# Patient Record
Sex: Male | Born: 1980 | Hispanic: Yes | Marital: Single | Smoking: Never smoker
Health system: Southern US, Community
[De-identification: ages and names within clinical notes are randomized; demographics above are authoritative.]

## PROBLEM LIST (undated history)

## (undated) DIAGNOSIS — M75100 Unspecified rotator cuff tear or rupture of unspecified shoulder, not specified as traumatic: Secondary | ICD-10-CM

## (undated) DIAGNOSIS — I10 Essential (primary) hypertension: Secondary | ICD-10-CM

## (undated) DIAGNOSIS — E785 Hyperlipidemia, unspecified: Secondary | ICD-10-CM

## (undated) DIAGNOSIS — K5900 Constipation, unspecified: Secondary | ICD-10-CM

## (undated) DIAGNOSIS — E039 Hypothyroidism, unspecified: Secondary | ICD-10-CM

## (undated) DIAGNOSIS — K219 Gastro-esophageal reflux disease without esophagitis: Secondary | ICD-10-CM

## (undated) DIAGNOSIS — E119 Type 2 diabetes mellitus without complications: Secondary | ICD-10-CM

## (undated) DIAGNOSIS — E559 Vitamin D deficiency, unspecified: Secondary | ICD-10-CM

## (undated) HISTORY — PX: SHOULDER SURGERY: SHX246

---

## 2017-09-04 ENCOUNTER — Ambulatory Visit: Payer: Self-pay

## 2017-09-04 ENCOUNTER — Other Ambulatory Visit: Payer: Self-pay | Admitting: Family Medicine

## 2017-09-04 DIAGNOSIS — M25521 Pain in right elbow: Secondary | ICD-10-CM

## 2017-09-04 DIAGNOSIS — M79641 Pain in right hand: Secondary | ICD-10-CM

## 2017-09-04 DIAGNOSIS — M25531 Pain in right wrist: Secondary | ICD-10-CM

## 2017-09-04 DIAGNOSIS — M25511 Pain in right shoulder: Secondary | ICD-10-CM

## 2017-09-07 ENCOUNTER — Other Ambulatory Visit: Payer: Self-pay

## 2017-09-07 ENCOUNTER — Other Ambulatory Visit: Payer: Self-pay | Admitting: Orthopedic Surgery

## 2017-09-07 ENCOUNTER — Encounter (HOSPITAL_BASED_OUTPATIENT_CLINIC_OR_DEPARTMENT_OTHER): Payer: Self-pay | Admitting: *Deleted

## 2017-09-07 NOTE — Progress Notes (Signed)
Spanish interpreter Ricki Rodriguezdriana [phone]) from [facility] was used to complete the pre-op phone screening.   A separate message was left with the office of inclusion to set up for a spanish interpreter the DOS- 09/08/17 to be here at 1300.

## 2017-09-14 NOTE — Pre-Procedure Instructions (Signed)
Maureen RalphsCesar will be interpreter for pt., per Darl PikesSusan at [facility]; please call [phone] if surgery time changes.

## 2017-09-15 ENCOUNTER — Ambulatory Visit (HOSPITAL_BASED_OUTPATIENT_CLINIC_OR_DEPARTMENT_OTHER)
Admission: RE | Admit: 2017-09-15 | Discharge: 2017-09-15 | Disposition: A | Discharge status: Home / Self Care | Payer: Worker's Compensation | Source: Ambulatory Visit | Attending: Orthopedic Surgery | Admitting: Orthopedic Surgery

## 2017-09-15 ENCOUNTER — Ambulatory Visit (HOSPITAL_BASED_OUTPATIENT_CLINIC_OR_DEPARTMENT_OTHER): Payer: Worker's Compensation | Admitting: Anesthesiology

## 2017-09-15 ENCOUNTER — Encounter (HOSPITAL_BASED_OUTPATIENT_CLINIC_OR_DEPARTMENT_OTHER): Payer: Self-pay | Admitting: Certified Registered"

## 2017-09-15 ENCOUNTER — Encounter (HOSPITAL_BASED_OUTPATIENT_CLINIC_OR_DEPARTMENT_OTHER)
Admission: RE | Disposition: A | Discharge status: Home / Self Care | Payer: Self-pay | Source: Ambulatory Visit | Attending: Orthopedic Surgery

## 2017-09-15 ENCOUNTER — Other Ambulatory Visit: Payer: Self-pay

## 2017-09-15 DIAGNOSIS — E039 Hypothyroidism, unspecified: Secondary | ICD-10-CM | POA: Insufficient documentation

## 2017-09-15 DIAGNOSIS — W1789XA Other fall from one level to another, initial encounter: Secondary | ICD-10-CM | POA: Diagnosis not present

## 2017-09-15 DIAGNOSIS — Z7982 Long term (current) use of aspirin: Secondary | ICD-10-CM | POA: Insufficient documentation

## 2017-09-15 DIAGNOSIS — E785 Hyperlipidemia, unspecified: Secondary | ICD-10-CM | POA: Diagnosis not present

## 2017-09-15 DIAGNOSIS — S52501A Unspecified fracture of the lower end of right radius, initial encounter for closed fracture: Secondary | ICD-10-CM | POA: Diagnosis present

## 2017-09-15 DIAGNOSIS — Z79899 Other long term (current) drug therapy: Secondary | ICD-10-CM | POA: Diagnosis not present

## 2017-09-15 DIAGNOSIS — E119 Type 2 diabetes mellitus without complications: Secondary | ICD-10-CM | POA: Diagnosis not present

## 2017-09-15 DIAGNOSIS — S52571A Other intraarticular fracture of lower end of right radius, initial encounter for closed fracture: Secondary | ICD-10-CM | POA: Diagnosis not present

## 2017-09-15 DIAGNOSIS — I10 Essential (primary) hypertension: Secondary | ICD-10-CM | POA: Insufficient documentation

## 2017-09-15 DIAGNOSIS — K219 Gastro-esophageal reflux disease without esophagitis: Secondary | ICD-10-CM | POA: Insufficient documentation

## 2017-09-15 DIAGNOSIS — Z7984 Long term (current) use of oral hypoglycemic drugs: Secondary | ICD-10-CM | POA: Diagnosis not present

## 2017-09-15 DIAGNOSIS — Y990 Civilian activity done for income or pay: Secondary | ICD-10-CM | POA: Insufficient documentation

## 2017-09-15 DIAGNOSIS — Z79890 Hormone replacement therapy (postmenopausal): Secondary | ICD-10-CM | POA: Diagnosis not present

## 2017-09-15 HISTORY — DX: Hyperlipidemia, unspecified: E78.5

## 2017-09-15 HISTORY — DX: Essential (primary) hypertension: I10

## 2017-09-15 HISTORY — PX: OPEN REDUCTION INTERNAL FIXATION (ORIF) DISTAL RADIAL FRACTURE: SHX5989

## 2017-09-15 HISTORY — DX: Gastro-esophageal reflux disease without esophagitis: K21.9

## 2017-09-15 HISTORY — DX: Type 2 diabetes mellitus without complications: E11.9

## 2017-09-15 HISTORY — DX: Hypothyroidism, unspecified: E03.9

## 2017-09-15 LAB — POCT I-STAT, CHEM 8
BUN: 10 mg/dL (ref 6–20)
CHLORIDE: 104 mmol/L (ref 98–111)
Calcium, Ion: 1.31 mmol/L (ref 1.15–1.40)
Creatinine, Ser: 0.8 mg/dL (ref 0.61–1.24)
Glucose, Bld: 120 mg/dL — ABNORMAL HIGH (ref 70–99)
HEMATOCRIT: 49 % (ref 39.0–52.0)
Hemoglobin: 16.7 g/dL (ref 13.0–17.0)
POTASSIUM: 3.9 mmol/L (ref 3.5–5.1)
SODIUM: 142 mmol/L (ref 135–145)
TCO2: 22 mmol/L (ref 22–32)

## 2017-09-15 SURGERY — OPEN REDUCTION INTERNAL FIXATION (ORIF) DISTAL RADIUS FRACTURE
Anesthesia: General | Site: Arm Lower | Laterality: Right

## 2017-09-15 MED ORDER — PROPOFOL 10 MG/ML IV BOLUS
INTRAVENOUS | Status: DC | PRN
  Administered 2017-09-15: 200 mg via INTRAVENOUS

## 2017-09-15 MED ORDER — MIDAZOLAM HCL 2 MG/2ML IJ SOLN
1.0000 mg | INTRAMUSCULAR | Status: DC | PRN
  Administered 2017-09-15: 2 mg via INTRAVENOUS

## 2017-09-15 MED ORDER — LIDOCAINE HCL (CARDIAC) PF 100 MG/5ML IV SOSY
PREFILLED_SYRINGE | INTRAVENOUS | Status: DC | PRN
  Administered 2017-09-15: 100 mg via INTRAVENOUS

## 2017-09-15 MED ORDER — ROPIVACAINE HCL 7.5 MG/ML IJ SOLN
INTRAMUSCULAR | Status: DC | PRN
  Administered 2017-09-15: 20 mL via PERINEURAL

## 2017-09-15 MED ORDER — LACTATED RINGERS IV SOLN
INTRAVENOUS | Status: DC
  Administered 2017-09-15: 13:00:00 via INTRAVENOUS

## 2017-09-15 MED ORDER — HYDROCODONE-ACETAMINOPHEN 5-325 MG PO TABS: 1.0000 | ORAL_TABLET | Freq: Four times a day (QID) | ORAL | 0 refills | Status: DC | PRN

## 2017-09-15 MED ORDER — FENTANYL CITRATE (PF) 100 MCG/2ML IJ SOLN: 25.0000 ug | INTRAMUSCULAR | Status: DC | PRN

## 2017-09-15 MED ORDER — SCOPOLAMINE 1 MG/3DAYS TD PT72
1.0000 | MEDICATED_PATCH | Freq: Once | TRANSDERMAL | Status: DC | PRN
Start: 2017-09-15 — End: 2017-09-15

## 2017-09-15 MED ORDER — PROMETHAZINE HCL 25 MG/ML IJ SOLN: 6.2500 mg | INTRAMUSCULAR | Status: DC | PRN

## 2017-09-15 MED ORDER — MIDAZOLAM HCL 2 MG/2ML IJ SOLN
INTRAMUSCULAR | Status: AC
  Filled 2017-09-15: qty 2

## 2017-09-15 MED ORDER — FENTANYL CITRATE (PF) 100 MCG/2ML IJ SOLN
50.0000 ug | INTRAMUSCULAR | Status: DC | PRN
  Administered 2017-09-15: 50 ug via INTRAVENOUS

## 2017-09-15 MED ORDER — CEFAZOLIN SODIUM-DEXTROSE 2-4 GM/100ML-% IV SOLN
2.0000 g | INTRAVENOUS | Status: AC
  Administered 2017-09-15: 2 g via INTRAVENOUS

## 2017-09-15 MED ORDER — FENTANYL CITRATE (PF) 100 MCG/2ML IJ SOLN
INTRAMUSCULAR | Status: AC
  Filled 2017-09-15: qty 2

## 2017-09-15 MED ORDER — CEFAZOLIN SODIUM-DEXTROSE 2-4 GM/100ML-% IV SOLN
INTRAVENOUS | Status: AC
  Filled 2017-09-15: qty 100

## 2017-09-15 MED ORDER — DEXAMETHASONE SODIUM PHOSPHATE 4 MG/ML IJ SOLN
INTRAMUSCULAR | Status: DC | PRN
  Administered 2017-09-15: 10 mg via INTRAVENOUS

## 2017-09-15 MED ORDER — CHLORHEXIDINE GLUCONATE 4 % EX LIQD: 60.0000 mL | Freq: Once | CUTANEOUS | Status: DC

## 2017-09-15 SURGICAL SUPPLY — 59 items
BIT DRILL 2.0 LNG QUCK RELEASE (BIT) ×1 IMPLANT
BIT DRILL 2.8X5 QR DISP (BIT) ×3 IMPLANT
BLADE MINI RND TIP GREEN BEAV (BLADE) ×3 IMPLANT
BLADE SURG 15 STRL LF DISP TIS (BLADE) ×1 IMPLANT
BLADE SURG 15 STRL SS (BLADE) ×2
BNDG COHESIVE 3X5 TAN STRL LF (GAUZE/BANDAGES/DRESSINGS) ×3 IMPLANT
BNDG ESMARK 4X9 LF (GAUZE/BANDAGES/DRESSINGS) ×3 IMPLANT
BNDG GAUZE ELAST 4 BULKY (GAUZE/BANDAGES/DRESSINGS) ×3 IMPLANT
CHLORAPREP W/TINT 26ML (MISCELLANEOUS) ×3 IMPLANT
CORD BIPOLAR FORCEPS 12FT (ELECTRODE) ×3 IMPLANT
COVER BACK TABLE 60X90IN (DRAPES) ×3 IMPLANT
COVER MAYO STAND STRL (DRAPES) ×3 IMPLANT
CUFF TOURNIQUET SINGLE 18IN (TOURNIQUET CUFF) ×3 IMPLANT
DECANTER SPIKE VIAL GLASS SM (MISCELLANEOUS) IMPLANT
DRAPE EXTREMITY T 121X128X90 (DRAPE) ×3 IMPLANT
DRAPE OEC MINIVIEW 54X84 (DRAPES) ×3 IMPLANT
DRAPE SURG 17X23 STRL (DRAPES) ×3 IMPLANT
DRILL 2.0 LNG QUICK RELEASE (BIT) ×3
GAUZE SPONGE 4X4 12PLY STRL (GAUZE/BANDAGES/DRESSINGS) ×3 IMPLANT
GAUZE XEROFORM 1X8 LF (GAUZE/BANDAGES/DRESSINGS) ×3 IMPLANT
GLOVE BIO SURGEON STRL SZ 6.5 (GLOVE) ×2 IMPLANT
GLOVE BIO SURGEONS STRL SZ 6.5 (GLOVE) ×1
GLOVE BIOGEL PI IND STRL 7.0 (GLOVE) ×2 IMPLANT
GLOVE BIOGEL PI IND STRL 8 (GLOVE) ×1 IMPLANT
GLOVE BIOGEL PI IND STRL 8.5 (GLOVE) ×1 IMPLANT
GLOVE BIOGEL PI INDICATOR 7.0 (GLOVE) ×4
GLOVE BIOGEL PI INDICATOR 8 (GLOVE) ×2
GLOVE BIOGEL PI INDICATOR 8.5 (GLOVE) ×2
GLOVE SURG ORTHO 8.0 STRL STRW (GLOVE) ×3 IMPLANT
GOWN STRL REUS W/ TWL LRG LVL3 (GOWN DISPOSABLE) ×1 IMPLANT
GOWN STRL REUS W/TWL LRG LVL3 (GOWN DISPOSABLE) ×2
GOWN STRL REUS W/TWL XL LVL3 (GOWN DISPOSABLE) ×6 IMPLANT
GUIDEWIRE ORTHO 0.054X6 (WIRE) ×9 IMPLANT
NEEDLE PRECISIONGLIDE 27X1.5 (NEEDLE) IMPLANT
NS IRRIG 1000ML POUR BTL (IV SOLUTION) ×3 IMPLANT
PACK BASIN DAY SURGERY FS (CUSTOM PROCEDURE TRAY) ×3 IMPLANT
PAD CAST 3X4 CTTN HI CHSV (CAST SUPPLIES) ×1 IMPLANT
PADDING CAST COTTON 3X4 STRL (CAST SUPPLIES) ×2
PLATE R NARROW PROC VDR (Plate) ×3 IMPLANT
SCREW CORT FT 18X2.3XLCK HEX (Screw) ×1 IMPLANT
SCREW CORT FT 20X2.3XLCK HEX (Screw) ×1 IMPLANT
SCREW CORTICAL LOCKING 2.3X18M (Screw) ×2 IMPLANT
SCREW CORTICAL LOCKING 2.3X20M (Screw) ×8 IMPLANT
SCREW CORTICAL LOCKING 2.3X22M (Screw) ×2 IMPLANT
SCREW FX20X2.3XSMTH LCK NS CRT (Screw) ×3 IMPLANT
SCREW FX22X2.3XLCK SMTH NS CRT (Screw) ×1 IMPLANT
SCREW NLCKG 13 3.5X13 HEXA (Screw) ×1 IMPLANT
SCREW NON-LOCK 3.5X13 (Screw) ×3 IMPLANT
SCREW NONLOCK HEX 3.5X12 (Screw) ×6 IMPLANT
SLEEVE SCD COMPRESS KNEE MED (MISCELLANEOUS) ×3 IMPLANT
SPLINT PLASTER CAST XFAST 3X15 (CAST SUPPLIES) ×10 IMPLANT
SPLINT PLASTER XTRA FASTSET 3X (CAST SUPPLIES) ×20
STOCKINETTE 4X48 STRL (DRAPES) ×3 IMPLANT
SUT ETHILON 4 0 PS 2 18 (SUTURE) ×3 IMPLANT
SUT VICRYL 4-0 PS2 18IN ABS (SUTURE) ×3 IMPLANT
SYR BULB 3OZ (MISCELLANEOUS) ×3 IMPLANT
SYR CONTROL 10ML LL (SYRINGE) IMPLANT
TOWEL GREEN STERILE FF (TOWEL DISPOSABLE) ×3 IMPLANT
UNDERPAD 30X30 (UNDERPADS AND DIAPERS) ×3 IMPLANT

## 2017-09-15 NOTE — Anesthesia Procedure Notes (Signed)
Anesthesia Regional Block: Supraclavicular block   Pre-Anesthetic Checklist: ,, timeout performed, Correct Patient, Correct Site, Correct Laterality, Correct Procedure, Correct Position, site marked, Risks and benefits discussed,  Surgical consent,  Pre-op evaluation,  At surgeon's request and post-op pain management  Laterality: Right  Prep: chloraprep       Needles:  Injection technique: Single-shot  Needle Type: Echogenic Needle     Needle Length: 9cm  Needle Gauge: 21     Additional Needles:   Procedures:,,,, ultrasound used (permanent image in chart),,,,  Narrative:  Start time: 09/15/2017 1:05 PM End time: 09/15/2017 1:10 PM Injection made incrementally with aspirations every 5 mL.  Performed by: Personally  Anesthesiologist: Cecile Hearingurk, Demetric Parslow Edward, MD  Additional Notes: No pain on injection. No increased resistance to injection. Injection made in 5cc increments.  Good needle visualization.  Patient tolerated procedure well.

## 2017-09-15 NOTE — Anesthesia Procedure Notes (Signed)
Procedure Name: LMA Insertion Date/Time: 09/15/2017 1:42 PM Performed by: Montez Moritaarter, Tarnisha Kachmar W, CRNA Pre-anesthesia Checklist: Patient identified, Emergency Drugs available, Suction available and Patient being monitored Patient Re-evaluated:Patient Re-evaluated prior to induction Oxygen Delivery Method: Circle system utilized Preoxygenation: Pre-oxygenation with 100% oxygen Induction Type: IV induction Ventilation: Mask ventilation without difficulty LMA: LMA inserted LMA Size: 4.0 Number of attempts: 1 Placement Confirmation: positive ETCO2 and breath sounds checked- equal and bilateral Tube secured with: Tape Dental Injury: Teeth and Oropharynx as per pre-operative assessment

## 2017-09-15 NOTE — Op Note (Signed)
NAME: Terrence Doyle MEDICAL RECORD NO: [id number] DATE OF BIRTH: Oct 22, 1980 FACILITY: [facility] LOCATION: [facility] PHYSICIAN: Terrence Reaper, MD   OPERATIVE REPORT   DATE OF PROCEDURE: 09/15/17    PREOPERATIVE DIAGNOSIS:   Distal radius fracture right wrist part   POSTOPERATIVE DIAGNOSIS:   Same   PROCEDURE:   Open reduction internal fixation Acumed volar plate right wrist   SURGEON: Terrence Doyle, M.D.   ASSISTANT: none Terrence Loa, MD   ANESTHESIA:  Bier block   INTRAVENOUS FLUIDS:  Per anesthesia flow sheet.   ESTIMATED BLOOD LOSS:  Minimal.   COMPLICATIONS:  None.   SPECIMENS:  none   TOURNIQUET TIME:    Total Tourniquet Time Documented: Upper Arm (Right) - 43 minutes Total: Upper Arm (Right) - 43 minutes    DISPOSITION:  Stable to PACU.   INDICATIONS: Patient is a 37 year old male who sustained a fracture comminuted intra-articular of his right distal radius and a fall wall trimming a tree.  He underwent closed reduction.  This has slipped.  He is admitted for open reduction internal fixation.  Pre-peri-and postoperative course been discussed along with risk complications.  He is aware that there is no guarantee to the surgery the possibility of infection recurrence injury to arteries nerves tendons and complete relief symptoms dystrophy loss of fixation nonunion.  Preoperative area the patient is seen extremity marked by both patient and surgeon antibiotic given a supraclavicular block was carried out without difficulty in the preoperative area under the direction the anesthesia department.  OPERATIVE COURSE: The patient was brought to the operating room where he was placed in the supine position with the right arm free.  He was prepped and draped using ChloraPrep and a three-minute dry time allowed timeout taken to confirm patient procedure.  The limb was exsanguinated with an Esmarch bandage turn placed on the arm was inflated to 250 mmHg.  A volar  incision was made for placement of the volar distal radius plate.  This carried down through subcutaneous tissue.  Bleeders were electrocauterized with bipolar.  The volar branch of the radial artery was identified protected and incision made through the flexor carpi radialis tendon sheath carried down to the pronator quadratus.  The brachial radialis was then tenotomized to relieving the radial styloid.  The incisions were made in the pronator quadratus L and shape at the watershed area.  The pronator was then elevated off the distal radius.  The displaced fracture was immediately apparent.  This was debrided after opening it up and reducing it after placing clamps the proximally.  A Acumed volar plate was placed pinned in position was found to be proximal and slightly large.  This was replaced with a narrow plate.  This was repositioned pinned in position this was adjusted to using the image intensification until applied in good position both AP and lateral direction.  The gliding screw was placed this measured 12 mm.  Distal pegs were then placed in the distal holes after drilling measuring these measured between 20 mm.  The radial screws were placed this was 18 and 21 peg was 22 on the area of the Lister's tubercle.  Final distal PEG was placed as measured 20 mm.  Of the proximal 2 screws holes were drilled these measured 13 and 12.  These were placed x-rays were taken revealing the fracture reduced with the plate in good position.  The wound was copiously irrigated with saline.  The pronator was repaired with figure-of-eight 4-0  Vicryl sutures.  The subcutaneous tissue was closed with interrupted 4-0 Vicryl and skin with interrupted 4-0 nylon sutures.  A sterile compressive dressing volar splint was applied.  Pronation supination revealed no impingement.  With full pronation supination being available.  Patient was taken to the recovery room for observation in satisfactory condition.  He will be discharged home  on Terrence Doyle.   Terrence Doyle R, MD Electronically signed, 09/15/17

## 2017-09-15 NOTE — Op Note (Signed)
I assisted Surgeon(s) and Role:    * Cindee SaltKuzma, Gary, MD - Primary    Betha Loa* Lydie Stammen, MD - Assisting on the Procedure(s): OPEN REDUCTION INTERNAL FIXATION (ORIF) DISTAL RADIAL FRACTURE on 09/15/2017.  I provided assistance on this case as follows: retraction soft tissues, reduction of fracture, placement hardware.  Electronically signed by: Tami RibasKUZMA,Wave Calzada R, MD Date: 09/15/2017 Time: 2:40 PM

## 2017-09-15 NOTE — Transfer of Care (Signed)
Immediate Anesthesia Transfer of Care Note  Patient: Terrence Doyle  Procedure(s) Performed: OPEN REDUCTION INTERNAL FIXATION (ORIF) DISTAL RADIAL FRACTURE (Right Arm Lower)  Patient Location: PACU  Anesthesia Type:General  Level of Consciousness: sedated  Airway & Oxygen Therapy: Patient Spontanous Breathing and Patient connected to face mask oxygen  Post-op Assessment: Report given to RN and Post -op Vital signs reviewed and stable  Post vital signs: Reviewed and stable  Last Vitals:  Vitals Value Taken Time  BP 84/59 09/15/2017  2:40 PM  Temp    Pulse 58 09/15/2017  2:41 PM  Resp 14 09/15/2017  2:41 PM  SpO2 100 % 09/15/2017  2:41 PM  Vitals shown include unvalidated device data.  Last Pain:  Vitals:   09/15/17 1228  TempSrc: Oral  PainSc: 0-No pain         Complications: No apparent anesthesia complications

## 2017-09-15 NOTE — H&P (Signed)
Terrence Doyle is an 37 y.o. male.   Chief Complaint: distal radius fracture right HPI: Terrence Doyle is a 37 year old right-hand-dominant male who sustained an injury to his right dominant hand in a fall on 09/04/2017. He was working on a tree. Is on a wall when he slipped and fell on outstretched hand. [facility] Dr[facility] Georgina PillionMassey and referred. X-rays reveal a comminuted intra-articular fracture of his right distal radius displaced. He complains of moderate pain was given Toradol. Is placed in a splint and referred. He has a prior history at the age of 89 when he fell from horse. His pain has significantly abated with the thumb spica splint. He has a history of diabetes thyroid problems no history of arthritis or gout. Family history is positive diabetes negative for the remainder.      Past Medical History:  Diagnosis Date  . Diabetes mellitus without complication (HCC)   . GERD (gastroesophageal reflux disease)   . Hyperlipidemia   . Hypertension   . Hypothyroidism     History reviewed. No pertinent surgical history.  History reviewed. No pertinent family history. Social History:  reports that he has never smoked. He has never used smokeless tobacco. He reports that he does not drink alcohol or use drugs.  Allergies: No Known Allergies  No medications prior to admission.    No results found for this or any previous visit (from the past 48 hour(s)).  No results found.   Pertinent items are noted in HPI.  Weight 77.1 kg (170 lb).  General appearance: alert, cooperative and appears stated age Head: Normocephalic, without obvious abnormality Neck: no JVD Resp: clear to auscultation bilaterally Cardio: regular rate and rhythm, S1, S2 normal, no murmur, click, rub or gallop GI: soft, non-tender; bowel sounds normal; no masses,  no organomegaly Extremities: deformity right wrist Pulses: 2+ and symmetric Skin: Skin color, texture, turgor normal. No rashes or  lesions Neurologic: Grossly normal Incision/Wound: na  Assessment/Plan Assessment:  1. Other closed fracture of distal end of right radius,    Plan: Dr. Georgina PillionMassey notes are reviewed. We have discussed the injury through a virtual interpreter. He is aware that this probably going to require open reduction internal fixation but would return back recommend hematoma block and reduction followed by splinting for improved positioning. Permit is obtained for this the hematoma block was given with percent Xylocaine without epinephrine. Manipulation was performed with application dorsal palmar splint. Will be discharged on him at all. Post reduction x-rays reveal a fracture in good position but is markedly comminuted. Through the interpreter we have discussed with him that this is going to require open reduction internal fixation. Pre-peri-and postoperative course are discussed along with risk complications. He is advised that there is no guarantee that with the surgery the possibility of infection recurrence injury to arteries nerves tendons complete relief symptoms dystrophy the possibility of nonunion. Stability of fixation problems. He is scheduled for open reduction internal fixation right distal radius and outpatient under regional anesthesia.      Arely Tinner R 09/15/2017, 11:06 AM

## 2017-09-15 NOTE — Anesthesia Preprocedure Evaluation (Signed)
Anesthesia Evaluation  Patient identified by MRN, date of birth, ID band Patient awake    Reviewed: Allergy & Precautions, NPO status , Patient's Chart, lab work & pertinent test results  Airway Mallampati: II  TM Distance: >3 FB Neck ROM: Full    Dental  (+) Teeth Intact, Dental Advisory Given, Chipped,    Pulmonary neg pulmonary ROS,    Pulmonary exam normal breath sounds clear to auscultation       Cardiovascular hypertension, Pt. on medications Normal cardiovascular exam Rhythm:Regular Rate:Normal     Neuro/Psych negative neurological ROS  negative psych ROS   GI/Hepatic Neg liver ROS, GERD  Medicated,  Endo/Other  diabetes, Type 2, Oral Hypoglycemic AgentsHypothyroidism   Renal/GU negative Renal ROS     Musculoskeletal negative musculoskeletal ROS (+)   Abdominal   Peds  Hematology negative hematology ROS (+)   Anesthesia Other Findings Day of surgery medications reviewed with the patient.  Reproductive/Obstetrics                             Anesthesia Physical Anesthesia Plan  ASA: II  Anesthesia Plan: General   Post-op Pain Management:  Regional for Post-op pain   Induction: Intravenous  PONV Risk Score and Plan: 2 and Dexamethasone, Ondansetron and Midazolam  Airway Management Planned: LMA  Additional Equipment:   Intra-op Plan:   Post-operative Plan: Extubation in OR  Informed Consent: I have reviewed the patients History and Physical, chart, labs and discussed the procedure including the risks, benefits and alternatives for the proposed anesthesia with the patient or authorized representative who has indicated his/her understanding and acceptance.   Dental advisory given  Plan Discussed with: CRNA  Anesthesia Plan Comments: (Spanish interpreter)        Anesthesia Quick Evaluation

## 2017-09-15 NOTE — Progress Notes (Signed)
Assisted Dr. Turk with right, ultrasound guided, supraclavicular block. Side rails up, monitors on throughout procedure. See vital signs in flow sheet. Tolerated Procedure well. 

## 2017-09-15 NOTE — Brief Op Note (Signed)
09/15/2017  2:37 PM  PATIENT:  Terrence Doyle  37 y.o. male  PRE-OPERATIVE DIAGNOSIS:  RIGHT DISTAL RADIUS FRACTURE  POST-OPERATIVE DIAGNOSIS:  RIGHT DISTAL RADIUS FRACTURE  PROCEDURE:  Procedure(s) with comments: OPEN REDUCTION INTERNAL FIXATION (ORIF) DISTAL RADIAL FRACTURE (Right) - block  SURGEON:  Surgeon(s) and Role:    * Cindee SaltKuzma, Kalisi Bevill, MD - Primary    * Betha LoaKuzma, Kevin, MD - Assisting  PHYSICIAN ASSISTANT:   ASSISTANTSANESTHESIA:   regional and IV sedation  EBL:  10 mL   BLOOD ADMINISTERED:none  DRAINS: none   LOCAL MEDICATIONS USED:  NONE  SPECIMEN:  No Specimen  DISPOSITION OF SPECIMEN:  N/A  COUNTS:  YES  TOURNIQUET:   Total Tourniquet Time Documented: Upper Arm (Right) - 43 minutes Total: Upper Arm (Right) - 43 minutes   DICTATION: .Reubin Milanragon Dictation  PLAN OF CARE: Discharge to home after PACU  PATIENT DISPOSITION:  PACU - hemodynamically stable.

## 2017-09-15 NOTE — Discharge Instructions (Addendum)

## 2017-09-16 ENCOUNTER — Encounter (HOSPITAL_BASED_OUTPATIENT_CLINIC_OR_DEPARTMENT_OTHER): Payer: Self-pay | Admitting: Orthopedic Surgery

## 2017-09-16 NOTE — Anesthesia Postprocedure Evaluation (Signed)
Anesthesia Post Note  Patient: Terrence Doyle  Procedure(s) Performed: OPEN REDUCTION INTERNAL FIXATION (ORIF) DISTAL RADIAL FRACTURE (Right Arm Lower)     Patient location during evaluation: PACU Anesthesia Type: General Level of consciousness: awake and alert Pain management: pain level controlled Vital Signs Assessment: post-procedure vital signs reviewed and stable Respiratory status: spontaneous breathing, nonlabored ventilation and respiratory function stable Cardiovascular status: blood pressure returned to baseline and stable Postop Assessment: no apparent nausea or vomiting Anesthetic complications: no    Last Vitals:  Vitals:   09/15/17 1530 09/15/17 1600  BP: 113/89 (!) 126/98  Pulse: 75 68  Resp: 18 16  Temp:  36.7 C  SpO2: 96% 96%    Last Pain:  Vitals:   09/15/17 1600  TempSrc:   PainSc: 0-No pain                 Cecile HearingStephen Edward Treyvin Glidden

## 2018-03-15 ENCOUNTER — Other Ambulatory Visit: Payer: Self-pay | Admitting: Orthopedic Surgery

## 2018-03-17 ENCOUNTER — Other Ambulatory Visit: Payer: Self-pay

## 2018-03-17 ENCOUNTER — Encounter (HOSPITAL_COMMUNITY): Payer: Self-pay | Admitting: *Deleted

## 2018-03-17 NOTE — Progress Notes (Signed)
SDW-pre-op call complete using Spanish Interpreter Vernona Rieger # [phone]. Pt denies SOB, chest pain, and being under the care of a cardiologist. Pt denies having a stress test, echo and cardiac cath. Pt denies having a chest x ray within the last year. Pt made aware to stop taking Aspirin,vitamins, fish oil and herbal medications. Do not take any NSAIDs ie: Ibuprofen, Advil, Naproxen (Aleve), Motrin, BC and Goody Powder. Pt made aware to not take Jardiance and Janumet diabetes medications on DOS. Pt stated that he had already taken Jardiance today when instructed to hold. Pt made aware to check BG every 2 hours prior to arrival to hospital on DOS. Pt made aware to treat a BG < 70 with 4 glucose tabs  wait 15 minutes after intervention to recheck BG, if BG remains < 70, call Short Stay unit to speak with a nurse. Pt verbalized understanding of all pre-op instructions.

## 2018-03-18 ENCOUNTER — Encounter (HOSPITAL_COMMUNITY): Payer: Self-pay

## 2018-03-18 ENCOUNTER — Ambulatory Visit (HOSPITAL_COMMUNITY)
Admission: RE | Admit: 2018-03-18 | Discharge: 2018-03-18 | Disposition: A | Discharge status: Home / Self Care | Payer: Worker's Compensation | Attending: Orthopedic Surgery | Admitting: Orthopedic Surgery

## 2018-03-18 ENCOUNTER — Encounter (HOSPITAL_COMMUNITY)
Admission: RE | Disposition: A | Discharge status: Home / Self Care | Payer: Self-pay | Source: Home / Self Care | Attending: Orthopedic Surgery

## 2018-03-18 ENCOUNTER — Ambulatory Visit (HOSPITAL_COMMUNITY): Payer: Worker's Compensation | Admitting: Anesthesiology

## 2018-03-18 DIAGNOSIS — E559 Vitamin D deficiency, unspecified: Secondary | ICD-10-CM | POA: Diagnosis not present

## 2018-03-18 DIAGNOSIS — E785 Hyperlipidemia, unspecified: Secondary | ICD-10-CM | POA: Insufficient documentation

## 2018-03-18 DIAGNOSIS — S46011D Strain of muscle(s) and tendon(s) of the rotator cuff of right shoulder, subsequent encounter: Secondary | ICD-10-CM | POA: Insufficient documentation

## 2018-03-18 DIAGNOSIS — K219 Gastro-esophageal reflux disease without esophagitis: Secondary | ICD-10-CM | POA: Diagnosis not present

## 2018-03-18 DIAGNOSIS — Z7982 Long term (current) use of aspirin: Secondary | ICD-10-CM | POA: Diagnosis not present

## 2018-03-18 DIAGNOSIS — Z7984 Long term (current) use of oral hypoglycemic drugs: Secondary | ICD-10-CM | POA: Insufficient documentation

## 2018-03-18 DIAGNOSIS — E039 Hypothyroidism, unspecified: Secondary | ICD-10-CM | POA: Diagnosis not present

## 2018-03-18 DIAGNOSIS — E119 Type 2 diabetes mellitus without complications: Secondary | ICD-10-CM | POA: Diagnosis not present

## 2018-03-18 DIAGNOSIS — Z79899 Other long term (current) drug therapy: Secondary | ICD-10-CM | POA: Diagnosis not present

## 2018-03-18 DIAGNOSIS — X58XXXD Exposure to other specified factors, subsequent encounter: Secondary | ICD-10-CM | POA: Insufficient documentation

## 2018-03-18 DIAGNOSIS — I10 Essential (primary) hypertension: Secondary | ICD-10-CM | POA: Diagnosis not present

## 2018-03-18 DIAGNOSIS — Z79890 Hormone replacement therapy (postmenopausal): Secondary | ICD-10-CM | POA: Diagnosis not present

## 2018-03-18 HISTORY — DX: Vitamin D deficiency, unspecified: E55.9

## 2018-03-18 HISTORY — DX: Unspecified rotator cuff tear or rupture of unspecified shoulder, not specified as traumatic: M75.100

## 2018-03-18 HISTORY — DX: Constipation, unspecified: K59.00

## 2018-03-18 HISTORY — PX: ARTHOSCOPIC ROTAOR CUFF REPAIR: SHX5002

## 2018-03-18 LAB — CBC
HCT: 51.1 % (ref 39.0–52.0)
HEMOGLOBIN: 17 g/dL (ref 13.0–17.0)
MCH: 29.6 pg (ref 26.0–34.0)
MCHC: 33.3 g/dL (ref 30.0–36.0)
MCV: 88.9 fL (ref 80.0–100.0)
Platelets: 288 10*3/uL (ref 150–400)
RBC: 5.75 MIL/uL (ref 4.22–5.81)
RDW: 13.3 % (ref 11.5–15.5)
WBC: 6.9 10*3/uL (ref 4.0–10.5)
nRBC: 0 % (ref 0.0–0.2)

## 2018-03-18 LAB — BASIC METABOLIC PANEL
Anion gap: 12 (ref 5–15)
BUN: 13 mg/dL (ref 6–20)
CO2: 21 mmol/L — ABNORMAL LOW (ref 22–32)
Calcium: 9.8 mg/dL (ref 8.9–10.3)
Chloride: 106 mmol/L (ref 98–111)
Creatinine, Ser: 0.97 mg/dL (ref 0.61–1.24)
GFR calc Af Amer: >60 mL/min (ref >60)
GFR calc non Af Amer: >60 mL/min (ref >60)
Glucose, Bld: 97 mg/dL (ref 70–99)
Potassium: 4.1 mmol/L (ref 3.5–5.1)
Sodium: 139 mmol/L (ref 135–145)

## 2018-03-18 LAB — GLUCOSE, CAPILLARY: GLUCOSE-CAPILLARY: 99 mg/dL (ref 70–99)

## 2018-03-18 SURGERY — REPAIR, ROTATOR CUFF, ARTHROSCOPIC
Anesthesia: Regional | Laterality: Right

## 2018-03-18 MED ORDER — CEFAZOLIN SODIUM-DEXTROSE 2-4 GM/100ML-% IV SOLN
2.0000 g | INTRAVENOUS | Status: AC
  Administered 2018-03-18: 2 g via INTRAVENOUS
  Filled 2018-03-18: qty 100

## 2018-03-18 MED ORDER — DEXAMETHASONE SODIUM PHOSPHATE 10 MG/ML IJ SOLN
INTRAMUSCULAR | Status: AC
  Filled 2018-03-18: qty 1

## 2018-03-18 MED ORDER — LIDOCAINE 2% (20 MG/ML) 5 ML SYRINGE
INTRAMUSCULAR | Status: AC
  Filled 2018-03-18: qty 5

## 2018-03-18 MED ORDER — ROCURONIUM BROMIDE 50 MG/5ML IV SOSY
PREFILLED_SYRINGE | INTRAVENOUS | Status: AC
  Filled 2018-03-18: qty 5

## 2018-03-18 MED ORDER — BUPIVACAINE LIPOSOME 1.3 % IJ SUSP
INTRAMUSCULAR | Status: DC | PRN
  Administered 2018-03-18: 10 mL via PERINEURAL

## 2018-03-18 MED ORDER — FENTANYL CITRATE (PF) 100 MCG/2ML IJ SOLN
INTRAMUSCULAR | Status: AC
  Administered 2018-03-18: 50 ug via INTRAVENOUS
  Filled 2018-03-18: qty 2

## 2018-03-18 MED ORDER — MIDAZOLAM HCL 2 MG/2ML IJ SOLN
2.0000 mg | Freq: Once | INTRAMUSCULAR | Status: AC
  Administered 2018-03-18: 2 mg via INTRAVENOUS

## 2018-03-18 MED ORDER — SODIUM CHLORIDE 0.9 % IV SOLN
INTRAVENOUS | Status: DC | PRN
  Administered 2018-03-18: 10 ug/min via INTRAVENOUS

## 2018-03-18 MED ORDER — GLYCOPYRROLATE PF 0.2 MG/ML IJ SOSY
PREFILLED_SYRINGE | INTRAMUSCULAR | Status: AC
  Filled 2018-03-18: qty 1

## 2018-03-18 MED ORDER — ROCURONIUM BROMIDE 50 MG/5ML IV SOSY
PREFILLED_SYRINGE | INTRAVENOUS | Status: DC | PRN
  Administered 2018-03-18: 50 mg via INTRAVENOUS

## 2018-03-18 MED ORDER — CHLORHEXIDINE GLUCONATE 4 % EX LIQD: 60.0000 mL | Freq: Once | CUTANEOUS | Status: DC

## 2018-03-18 MED ORDER — BUPIVACAINE HCL (PF) 0.5 % IJ SOLN
INTRAMUSCULAR | Status: DC | PRN
  Administered 2018-03-18: 15 mL via PERINEURAL

## 2018-03-18 MED ORDER — LIDOCAINE 2% (20 MG/ML) 5 ML SYRINGE
INTRAMUSCULAR | Status: DC | PRN
  Administered 2018-03-18: 60 mg via INTRAVENOUS

## 2018-03-18 MED ORDER — LACTATED RINGERS IV SOLN
INTRAVENOUS | Status: DC | PRN
  Administered 2018-03-18: 10:00:00 via INTRAVENOUS

## 2018-03-18 MED ORDER — PROPOFOL 10 MG/ML IV BOLUS
INTRAVENOUS | Status: AC
  Filled 2018-03-18: qty 20

## 2018-03-18 MED ORDER — SODIUM CHLORIDE 0.9 % IR SOLN
Status: DC | PRN
  Administered 2018-03-18: 20000 mL

## 2018-03-18 MED ORDER — PHENYLEPHRINE 40 MCG/ML (10ML) SYRINGE FOR IV PUSH (FOR BLOOD PRESSURE SUPPORT)
PREFILLED_SYRINGE | INTRAVENOUS | Status: AC
  Filled 2018-03-18: qty 10

## 2018-03-18 MED ORDER — EPHEDRINE 5 MG/ML INJ
INTRAVENOUS | Status: AC
  Filled 2018-03-18: qty 10

## 2018-03-18 MED ORDER — ONDANSETRON HCL 4 MG/2ML IJ SOLN
INTRAMUSCULAR | Status: AC
  Filled 2018-03-18: qty 2

## 2018-03-18 MED ORDER — DEXAMETHASONE SODIUM PHOSPHATE 10 MG/ML IJ SOLN
INTRAMUSCULAR | Status: DC | PRN
  Administered 2018-03-18: 10 mg via INTRAVENOUS

## 2018-03-18 MED ORDER — SUGAMMADEX SODIUM 200 MG/2ML IV SOLN
INTRAVENOUS | Status: DC | PRN
  Administered 2018-03-18: 150 mg via INTRAVENOUS

## 2018-03-18 MED ORDER — TRANEXAMIC ACID-NACL 1000-0.7 MG/100ML-% IV SOLN
1000.0000 mg | INTRAVENOUS | Status: AC
  Administered 2018-03-18: 1000 mg via INTRAVENOUS
  Filled 2018-03-18: qty 100

## 2018-03-18 MED ORDER — FENTANYL CITRATE (PF) 100 MCG/2ML IJ SOLN
50.0000 ug | Freq: Once | INTRAMUSCULAR | Status: AC
  Administered 2018-03-18: 50 ug via INTRAVENOUS

## 2018-03-18 MED ORDER — FENTANYL CITRATE (PF) 100 MCG/2ML IJ SOLN
INTRAMUSCULAR | Status: DC | PRN
  Administered 2018-03-18: 50 ug via INTRAVENOUS

## 2018-03-18 MED ORDER — MIDAZOLAM HCL 2 MG/2ML IJ SOLN
INTRAMUSCULAR | Status: AC
  Administered 2018-03-18: 2 mg via INTRAVENOUS
  Filled 2018-03-18: qty 2

## 2018-03-18 MED ORDER — EPHEDRINE SULFATE-NACL 50-0.9 MG/10ML-% IV SOSY
PREFILLED_SYRINGE | INTRAVENOUS | Status: DC | PRN
  Administered 2018-03-18 (×2): 5 mg via INTRAVENOUS

## 2018-03-18 MED ORDER — GLYCOPYRROLATE PF 0.2 MG/ML IJ SOSY
PREFILLED_SYRINGE | INTRAMUSCULAR | Status: DC | PRN
  Administered 2018-03-18: .2 mg via INTRAVENOUS

## 2018-03-18 MED ORDER — PROPOFOL 10 MG/ML IV BOLUS
INTRAVENOUS | Status: DC | PRN
  Administered 2018-03-18: 120 mg via INTRAVENOUS

## 2018-03-18 MED ORDER — OXYCODONE-ACETAMINOPHEN 5-325 MG PO TABS: 1.0000 | ORAL_TABLET | Freq: Four times a day (QID) | ORAL | 0 refills | Status: AC | PRN

## 2018-03-18 MED ORDER — FENTANYL CITRATE (PF) 250 MCG/5ML IJ SOLN
INTRAMUSCULAR | Status: AC
  Filled 2018-03-18: qty 5

## 2018-03-18 MED ORDER — EPHEDRINE SULFATE 50 MG/ML IJ SOLN
INTRAMUSCULAR | Status: DC | PRN
  Administered 2018-03-18: 5 mg via INTRAVENOUS

## 2018-03-18 SURGICAL SUPPLY — 60 items
ANCHOR SUTURETAK 3X12.7 PEEK (Anchor) ×6 IMPLANT
BLADE SURG 11 STRL SS (BLADE) ×3 IMPLANT
BURR OVAL 8 FLU 4.0MM X 13CM (MISCELLANEOUS) ×1
BURR OVAL 8 FLU 4.0X13 (MISCELLANEOUS) ×2 IMPLANT
CANNULA 5.75X71 LONG (CANNULA) ×3 IMPLANT
CANNULA PASSPORT BUTTON (MISCELLANEOUS) IMPLANT
CANNULA TWIST IN 8.25X7CM (CANNULA) ×2 IMPLANT
CHLORAPREP W/TINT 26ML (MISCELLANEOUS) ×3 IMPLANT
COVER SURGICAL LIGHT HANDLE (MISCELLANEOUS) ×3 IMPLANT
CUTTER BONE 4.0MM X 13CM (MISCELLANEOUS) ×5 IMPLANT
DRAPE INCISE IOBAN 66X45 STRL (DRAPES) ×3 IMPLANT
DRAPE STERI 35X30 U-POUCH (DRAPES) ×3 IMPLANT
DRAPE SURG 17X23 STRL (DRAPES) ×3 IMPLANT
DRAPE U-SHAPE 47X51 STRL (DRAPES) ×3 IMPLANT
DRSG XEROFORM 1X8 (GAUZE/BANDAGES/DRESSINGS) ×2 IMPLANT
GAUZE SPONGE 4X4 12PLY STRL LF (GAUZE/BANDAGES/DRESSINGS) ×2 IMPLANT
GAUZE XEROFORM 1X8 LF (GAUZE/BANDAGES/DRESSINGS) ×3 IMPLANT
GLOVE BIO SURGEON STRL SZ7 (GLOVE) ×3 IMPLANT
GLOVE BIO SURGEON STRL SZ7.5 (GLOVE) ×3 IMPLANT
GLOVE BIOGEL PI IND STRL 7.0 (GLOVE) ×1 IMPLANT
GLOVE BIOGEL PI IND STRL 8 (GLOVE) ×1 IMPLANT
GLOVE BIOGEL PI INDICATOR 7.0 (GLOVE) ×2
GLOVE BIOGEL PI INDICATOR 8 (GLOVE) ×2
GOWN STRL REUS W/ TWL LRG LVL3 (GOWN DISPOSABLE) ×2 IMPLANT
GOWN STRL REUS W/TWL LRG LVL3 (GOWN DISPOSABLE) ×4
GRAFT TISS 40X70 3 THK DERM (Tissue) IMPLANT
KIT BASIN OR (CUSTOM PROCEDURE TRAY) ×3 IMPLANT
KIT PERC INSERT 3.0 KNTLS (KITS) ×2 IMPLANT
KIT TURNOVER KIT B (KITS) ×3 IMPLANT
MANIFOLD NEPTUNE II (INSTRUMENTS) ×3 IMPLANT
NDL SCORPION MULTI FIRE (NEEDLE) IMPLANT
NDL SPNL 18GX3.5 QUINCKE PK (NEEDLE) ×1 IMPLANT
NEEDLE SCORPION MULTI FIRE (NEEDLE) ×3 IMPLANT
NEEDLE SPNL 18GX3.5 QUINCKE PK (NEEDLE) ×3 IMPLANT
NS IRRIG 1000ML POUR BTL (IV SOLUTION) ×3 IMPLANT
PACK SHOULDER (CUSTOM PROCEDURE TRAY) ×3 IMPLANT
PAD ABD 7.5X8 STRL (GAUZE/BANDAGES/DRESSINGS) ×2 IMPLANT
PAD ARMBOARD 7.5X6 YLW CONV (MISCELLANEOUS) ×6 IMPLANT
PASSPORT BUTTON CANNULA (MISCELLANEOUS) ×3
PROBE BIPOLAR ATHRO 135MM 90D (MISCELLANEOUS) ×3 IMPLANT
RESTRAINT HEAD UNIVERSAL NS (MISCELLANEOUS) ×3 IMPLANT
SLING ARM FOAM STRAP LRG (SOFTGOODS) ×3 IMPLANT
SLING ARM FOAM STRAP MED (SOFTGOODS) IMPLANT
SPEEDBRIDGE W/PEEK SWIVELOCK (Orthopedic Implant) ×3 IMPLANT
SPONGE LAP 4X18 RFD (DISPOSABLE) IMPLANT
SUPPORT WRAP ARM LG (MISCELLANEOUS) IMPLANT
SUT 2 FIBERLOOP 20 STRT BLUE (SUTURE)
SUT FIBERWIRE #2 38 T-5 BLUE (SUTURE)
SUT TIGER TAPE 7 IN WHITE (SUTURE) IMPLANT
SUTURE 2 FIBERLOOP 20 STRT BLU (SUTURE) IMPLANT
SUTURE FIBERWR #2 38 T-5 BLUE (SUTURE) IMPLANT
SYR CONTROL 10ML LL (SYRINGE) ×3 IMPLANT
SYSTEM IMPLNT SPDBRDG PK SWVLK (Orthopedic Implant) IMPLANT
TAPE CLOTH SURG 6X10 WHT LF (GAUZE/BANDAGES/DRESSINGS) ×2 IMPLANT
TAPE FIBER 2MM 7IN #2 BLUE (SUTURE) IMPLANT
TISSUE ARTHOFLEX THICK 3MM (Tissue) ×3 IMPLANT
TUBE CONNECTING 12'X1/4 (SUCTIONS) ×1
TUBE CONNECTING 12X1/4 (SUCTIONS) ×2 IMPLANT
TUBING ARTHROSCOPY IRRIG 16FT (MISCELLANEOUS) ×3 IMPLANT
WATER STERILE IRR 1000ML POUR (IV SOLUTION) ×3 IMPLANT

## 2018-03-18 NOTE — H&P (Signed)
Terrence Doyle is an 38 y.o. male.   Chief Complaint: Right shoulder pain and weakness HPI: 38 year old male status post injury at work with irreparable rotator cuff tear.  He had an arthroscopic debridement and progressive therapy and was unable to get back to a point where he was satisfied with his pain relief or strength.  Indicated for superior capsular reconstruction to try and decrease pain and increase function.  Past Medical History:  Diagnosis Date  . Constipation   . Diabetes mellitus without complication (HCC)   . GERD (gastroesophageal reflux disease)   . Hyperlipidemia   . Hypertension   . Hypothyroidism   . Rotator cuff tear    irrepearable  . Vitamin D deficiency     Past Surgical History:  Procedure Laterality Date  . OPEN REDUCTION INTERNAL FIXATION (ORIF) DISTAL RADIAL FRACTURE Right 09/15/2017   Procedure: OPEN REDUCTION INTERNAL FIXATION (ORIF) DISTAL RADIAL FRACTURE;  Surgeon: Cindee SaltKuzma, Gary, MD;  Location:  SURGERY CENTER;  Service: Orthopedics;  Laterality: Right;  block  . SHOULDER SURGERY      Family History  Problem Relation Age of Onset  . Diabetes Mother    Social History:  reports that he has never smoked. He has never used smokeless tobacco. He reports that he does not drink alcohol or use drugs.  Allergies:  Allergies  Allergen Reactions  . Shrimp [Shellfish Allergy] Hives and Itching    Medications Prior to Admission  Medication Sig Dispense Refill  . aspirin EC 81 MG tablet Take 81 mg by mouth daily.    . empagliflozin (JARDIANCE) 10 MG TABS tablet Take 10 mg by mouth daily.    . ergocalciferol (VITAMIN D2) 1.25 MG (50000 UT) capsule Take 50,000 Units by mouth every Monday.    . levothyroxine (SYNTHROID, LEVOTHROID) 50 MCG tablet Take 50 mcg by mouth daily before breakfast.    . losartan (COZAAR) 50 MG tablet Take 50 mg by mouth 2 (two) times daily.    Marland Kitchen. lovastatin (MEVACOR) 20 MG tablet Take 20 mg by mouth daily.    .  sitaGLIPtin-metformin (JANUMET) 50-1000 MG tablet Take 1 tablet by mouth 2 (two) times daily with a meal.    . ranitidine (ZANTAC) 150 MG tablet Take 150 mg by mouth 2 (two) times daily as needed for heartburn.      Results for orders placed or performed during the hospital encounter of 03/18/18 (from the past 48 hour(s))  Glucose, capillary     Status: None   Collection Time: 03/18/18  9:48 AM  Result Value Ref Range   Glucose-Capillary 99 70 - 99 mg/dL  Basic metabolic panel     Status: Abnormal   Collection Time: 03/18/18  9:52 AM  Result Value Ref Range   Sodium 139 135 - 145 mmol/L   Potassium 4.1 3.5 - 5.1 mmol/L   Chloride 106 98 - 111 mmol/L   CO2 21 (L) 22 - 32 mmol/L   Glucose, Bld 97 70 - 99 mg/dL   BUN 13 6 - 20 mg/dL   Creatinine, Ser 1.610.97 0.61 - 1.24 mg/dL   Calcium 9.8 8.9 - 09.610.3 mg/dL   GFR calc non Af Amer >60 >60 mL/min   GFR calc Af Amer >60 >60 mL/min   Anion gap 12 5 - 15    Comment: Performed at [facility], [address] [place], [place] [id number]  CBC     Status: None   Collection Time: 03/18/18  9:52 AM  Result  Value Ref Range   WBC 6.9 4.0 - 10.5 K/uL   RBC 5.75 4.22 - 5.81 MIL/uL   Hemoglobin 17.0 13.0 - 17.0 g/dL   HCT 65.7 90.3 - 83.3 %   MCV 88.9 80.0 - 100.0 fL   MCH 29.6 26.0 - 34.0 pg   MCHC 33.3 30.0 - 36.0 g/dL   RDW 38.3 29.1 - 91.6 %   Platelets 288 150 - 400 K/uL   nRBC 0.0 0.0 - 0.2 %    Comment: Performed at [facility], [address], [place], [place] [address]   No results found.  Review of Systems  All other systems reviewed and are negative.   Blood pressure 120/87, pulse 67, temperature 98.4 F (36.9 C), temperature source Oral, resp. rate 18, height 5\' 5"  (1.651 m), weight 71.7 kg, SpO2 99 %. Physical Exam  Constitutional: He is oriented to person, place, and time. He appears well-developed and well-nourished.  HENT:  Head: Atraumatic.  Eyes: EOM are normal.  Cardiovascular: Intact distal pulses.   Respiratory: Effort normal.  Musculoskeletal:     Comments: Right shoulder pain and weakness with rotator cuff testing neurovascularly intact distally.  Neurological: He is alert and oriented to person, place, and time.  Skin: Skin is warm and dry.  Psychiatric: He has a normal mood and affect.     Assessment/Plan 38 year old male status post injury at work with irreparable rotator cuff tear.  He had an arthroscopic debridement and progressive therapy and was unable to get back to a point where he was satisfied with his pain relief or strength.  Indicated for superior capsular reconstruction to try and decrease pain and increase function.  Risks / benefits of surgery discussed Consent on chart  NPO for OR Preop antibiotics   Berline Lopes, MD 03/18/2018, 11:47 AM

## 2018-03-18 NOTE — Op Note (Signed)
Procedure(s): RIGHT ARTHROSCOPIC SUPERIOR CAPSULAR RECONSTRUCTION Procedure Note  Terrence Doyle male 38 y.o. 03/18/2018  Procedure(s) and Anesthesia Type:    * RIGHT SHOULDER ARTHROSCOPIC SUPERIOR CAPSULAR RECONSTRUCTION - General  Surgeon(s) and Role:    Jones Broom* Teniola Tseng, MD - Primary     Surgeon: Berline LopesJustin W Ziquan Fidel   Assistants: Dannielle BurnEric Phillips, PA-C.  [name] was scrubbed and present throughout the procedure and necessary for assistance with camera work instrumentation and closure.  Anesthesia: General endotracheal anesthesia with preoperative interscalene block given by the attending anesthesiologist   Procedure Detail  RIGHT ARTHROSCOPIC SUPERIOR CAPSULAR RECONSTRUCTION  Estimated Blood Loss: Min         Drains: none  Blood Given: none         Specimens: none        Complications:  * No complications entered in OR log *         Disposition: PACU - hemodynamically stable.         Condition: stable    Procedure:   INDICATIONS FOR SURGERY: The patient is 38 y.o. male who had a history of an injury at work and was subsequently found to have a massive rotator cuff tear.  It was initially felt to be an acute injury but upon arthroscopy attempt at rotator cuff repair it was found to be severely retracted and unable to be mobilized to be repaired.  It was debrided and he went through appropriate rehabilitation but continued to have pain and weakness after nearly 6 months of rehabilitation.  Upon further questioning of the patient it sounds like he did have a previous injury to the shoulder several years ago with likely rotator cuff tear.  It is presumed that at the time of his work injury this tear got larger.  He was indicated for superior capsular reconstruction to try and decrease pain and increase his function with the irreparable rotator cuff tear.  OPERATIVE FINDINGS: Examination under anesthesia: No stiffness or instability  DESCRIPTION OF PROCEDURE: The patient was  identified in preoperative  holding area where I personally marked the operative site after  verifying site, side, and procedure with the patient. An interscalene block was given by the attending anesthesiologist the holding area.  The patient was taken back to the operating room where general anesthesia was induced without complication and was placed in the beach-chair position with the back  elevated about 60 degrees and all extremities and head and neck carefully padded and  positioned.   The right upper extremity was then prepped and  draped in a standard sterile fashion. The appropriate time-out  procedure was carried out. The patient did receive IV antibiotics  within 30 minutes of incision.   A small posterior portal incision was made and the arthroscope was introduced into the joint. An anterior portal was then established superiorly [facility] using needle localization.  A large cannula was placed anteriorly.   Diagnostic arthroscopy was carried out and he was noted again to have a massive retracted tear of the rotator cuff involving supraspinatus into the infraspinatus.  The rotator cuff was retracted beyond the level of the glenoid.  There was some scar tissue above the superior labrum but no visible tendon.  The plane above the superior labrum was developed with ArthroCare and shaver and taken down to the superior surface of the glenoid.  The superior labrum was removed and the biceps was released.  The superior aspect of the glenoid extending around posteriorly was debrided down  to bleeding bone using a bur.  3 3 mm bio composite knotless suture tack anchors were then placed percutaneous anterior middle and posterior on the glenoid.  Attention was then turned to the greater tuberosity which was noted to be bare with no tendon attachments.  Far around posterior inferior there was some tendon which was felt to represent the lower aspect of the infraspinatus into the teres minor.   The tuberosity was debrided down to a bleeding bony surface with a bur to promote healing.  2 4.75 peek swivel lock anchors were then placed just off the articular margin anterior and posterior on the tuberosity.  The sutures were then brought out the lateral passport cannula.  Measurements were made between all of the anchor sites for graft preparation and the dermal allograft was then prepared on the back table.  The dermal allograft was then prepared outside of the passport cannula first passing the lateral row sutures and then passing the medial row sutures in a horizontal mattress configuration to pull the graft into the joint.  A large grasper was then used to introduce the graft into the joint while tensioning each suture sequentially.  The graft laid down nicely on the glenoid side securely over the prepared bony surface.  Medial sutures were cut.  Attention was then turned lateral again where after placing the arm in the appropriate position for tensioning the lateral row anchors were placed in a speed bridge configuration with one suture from each medial anchor in a crossing pattern.  This brought the lateral edge of the graft down nicely over the tuberosity.  The final construct was felt to be excellent with appropriate tension and perfectly sized.  There was not inability to tie into the posterior cuff as it was too far down, but I was able to put 2 side-to-side sutures with #2 FiberWire into some anterior interval tissue to help close this down without overtightening the area.  The arthroscopic equipment was removed from the joint and the portals were closed with 3-0 nylon in an interrupted fashion. Sterile dressings were then applied including Xeroform 4 x 4's ABDs and tape. The patient was then allowed to awaken from general anesthesia, placed in a sling, transferred to the stretcher and taken to the recovery room in stable condition.  This procedure will be billed as a [phone] rotator cuff  repair and a 53794 superior labral repair as the steps of this procedure closely mimic these 2 procedures together.   POSTOPERATIVE PLAN: The patient will be discharged home today and will followup in one week for suture removal and wound check.  He will follow the massive cuff protocol.

## 2018-03-18 NOTE — Anesthesia Preprocedure Evaluation (Addendum)
Anesthesia Evaluation  Patient identified by MRN, date of birth, ID band Patient awake    Reviewed: Allergy & Precautions, NPO status , Patient's Chart, lab work & pertinent test results  Airway Mallampati: I  TM Distance: >3 FB Neck ROM: Full    Dental no notable dental hx. (+) Teeth Intact, Dental Advisory Given,    Pulmonary    Pulmonary exam normal breath sounds clear to auscultation       Cardiovascular hypertension, Pt. on medications Normal cardiovascular exam Rhythm:Regular Rate:Normal     Neuro/Psych    GI/Hepatic GERD  Medicated and Controlled,  Endo/Other  diabetes, Type 2, Oral Hypoglycemic AgentsHypothyroidism   Renal/GU      Musculoskeletal   Abdominal   Peds  Hematology   Anesthesia Other Findings Right rotator cuff tear  Reproductive/Obstetrics                           Anesthesia Physical Anesthesia Plan  ASA: II  Anesthesia Plan: Regional and General   Post-op Pain Management:  Regional for Post-op pain   Induction: Intravenous  PONV Risk Score and Plan: 2 and Dexamethasone, Ondansetron and Midazolam  Airway Management Planned: Oral ETT  Additional Equipment:   Intra-op Plan:   Post-operative Plan: Extubation in OR  Informed Consent: I have reviewed the patients History and Physical, chart, labs and discussed the procedure including the risks, benefits and alternatives for the proposed anesthesia with the patient or authorized representative who has indicated his/her understanding and acceptance.     Dental advisory given  Plan Discussed with: CRNA  Anesthesia Plan Comments:         Anesthesia Quick Evaluation

## 2018-03-18 NOTE — Anesthesia Postprocedure Evaluation (Signed)
Anesthesia Post Note  Patient: Terrence Doyle  Procedure(s) Performed: RIGHT ARTHROSCOPIC SUPERIOR CAPSULAR RECONSTRUCTION (Right )     Patient location during evaluation: PACU Anesthesia Type: General Level of consciousness: sedated Pain management: pain level controlled Vital Signs Assessment: post-procedure vital signs reviewed and stable Respiratory status: spontaneous breathing and respiratory function stable Cardiovascular status: stable Postop Assessment: no apparent nausea or vomiting Anesthetic complications: no    Last Vitals:  Vitals:   03/18/18 1545 03/18/18 1551  BP: 117/81 109/72  Pulse: 88 82  Resp: 20   Temp:    SpO2: 96% 99%    Last Pain:  Vitals:   03/18/18 1551  TempSrc:   PainSc: 0-No pain                 Mikai Meints DANIEL

## 2018-03-18 NOTE — Anesthesia Procedure Notes (Signed)
Procedure Name: Intubation Date/Time: 03/18/2018 12:38 PM Performed by: Kyung Rudd, CRNA Pre-anesthesia Checklist: Patient identified, Emergency Drugs available, Suction available, Patient being monitored and Timeout performed Patient Re-evaluated:Patient Re-evaluated prior to induction Oxygen Delivery Method: Circle system utilized Preoxygenation: Pre-oxygenation with 100% oxygen Induction Type: IV induction Ventilation: Mask ventilation without difficulty Laryngoscope Size: Mac and 3 Grade View: Grade I Tube type: Oral Tube size: 7.0 mm Number of attempts: 2 (1st attempt by Gwenette Greet, paramedic. Unable to pass ETT.  2nd DL by CRNA.) Airway Equipment and Method: Stylet Placement Confirmation: ETT inserted through vocal cords under direct vision,  positive ETCO2 and breath sounds checked- equal and bilateral Secured at: 21 cm Dental Injury: Teeth and Oropharynx as per pre-operative assessment

## 2018-03-18 NOTE — Discharge Instructions (Addendum)

## 2018-03-18 NOTE — Anesthesia Procedure Notes (Signed)
Anesthesia Regional Block: Interscalene brachial plexus block   Pre-Anesthetic Checklist: ,, timeout performed, Correct Patient, Correct Site, Correct Laterality, Correct Procedure, Correct Position, site marked, Risks and benefits discussed,  Surgical consent,  Pre-op evaluation,  At surgeon's request and post-op pain management  Laterality: Right  Prep: Maximum Sterile Barrier Precautions used, chloraprep       Needles:  Injection technique: Single-shot  Needle Type: Echogenic Stimulator Needle     Needle Length: 4cm  Needle Gauge: 22     Additional Needles:   Procedures:,,,, ultrasound used (permanent image in chart),,,,  Narrative:  Start time: 03/18/2018 11:48 AM End time: 03/18/2018 11:58 AM Injection made incrementally with aspirations every 5 mL.  Performed by: Personally  Anesthesiologist: Elmer Picker, MD  Additional Notes: Monitors applied. No increased pain on injection. No increased resistance to injection. Injection made in 5cc increments. Good needle visualization. Patient tolerated procedure well.

## 2018-03-18 NOTE — Transfer of Care (Signed)
Immediate Anesthesia Transfer of Care Note  Patient: Terrence Doyle  Procedure(s) Performed: RIGHT ARTHROSCOPIC SUPERIOR CAPSULAR RECONSTRUCTION (Right )  Patient Location: PACU  Anesthesia Type:General  Level of Consciousness: awake  Airway & Oxygen Therapy: Patient Spontanous Breathing and Patient connected to nasal cannula oxygen  Post-op Assessment: Report given to RN, Post -op Vital signs reviewed and stable and Patient moving all extremities X 4  Post vital signs: Reviewed and stable  Last Vitals:  Vitals Value Taken Time  BP    Temp    Pulse 75 03/18/2018  3:23 PM  Resp 22 03/18/2018  3:23 PM  SpO2 99 % 03/18/2018  3:23 PM  Vitals shown include unvalidated device data.  Last Pain:  Vitals:   03/18/18 0959  TempSrc:   PainSc: 0-No pain         Complications: No apparent anesthesia complications

## 2018-03-22 ENCOUNTER — Encounter (HOSPITAL_COMMUNITY): Payer: Self-pay | Admitting: Orthopedic Surgery

## 2019-12-13 IMAGING — DX DG WRIST COMPLETE 3+V*R*
4 series · 4 of 4 positions shown · non-contrast
Comparison: None.

CLINICAL DATA: Fall, RIGHT wrist pain, obvious swelling.

EXAM:
RIGHT WRIST - COMPLETE 3+ VIEW

[wrist pa]
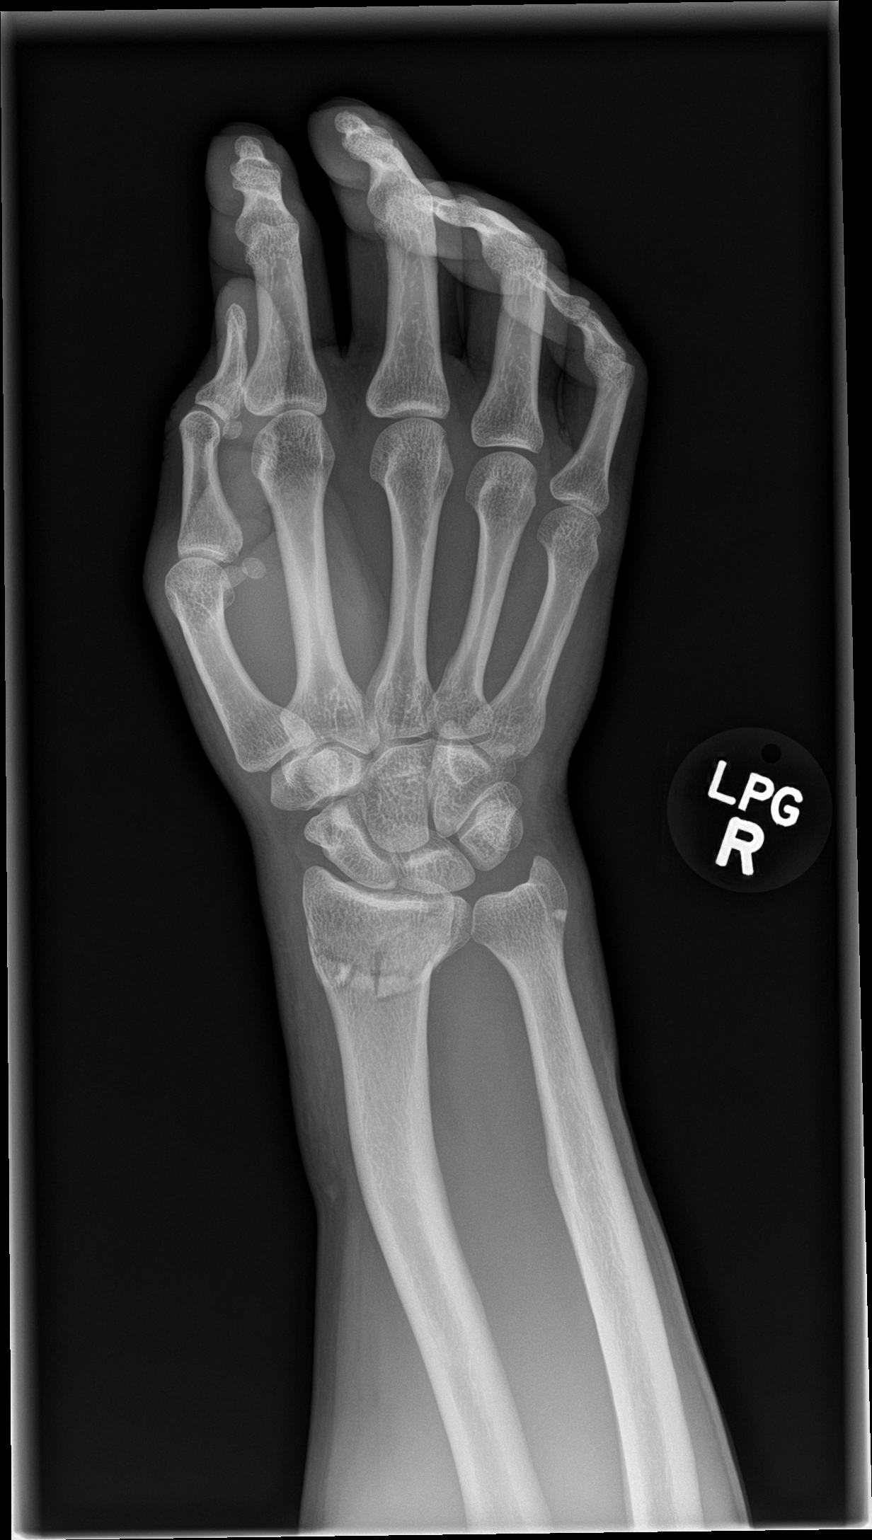

[wrist obl]
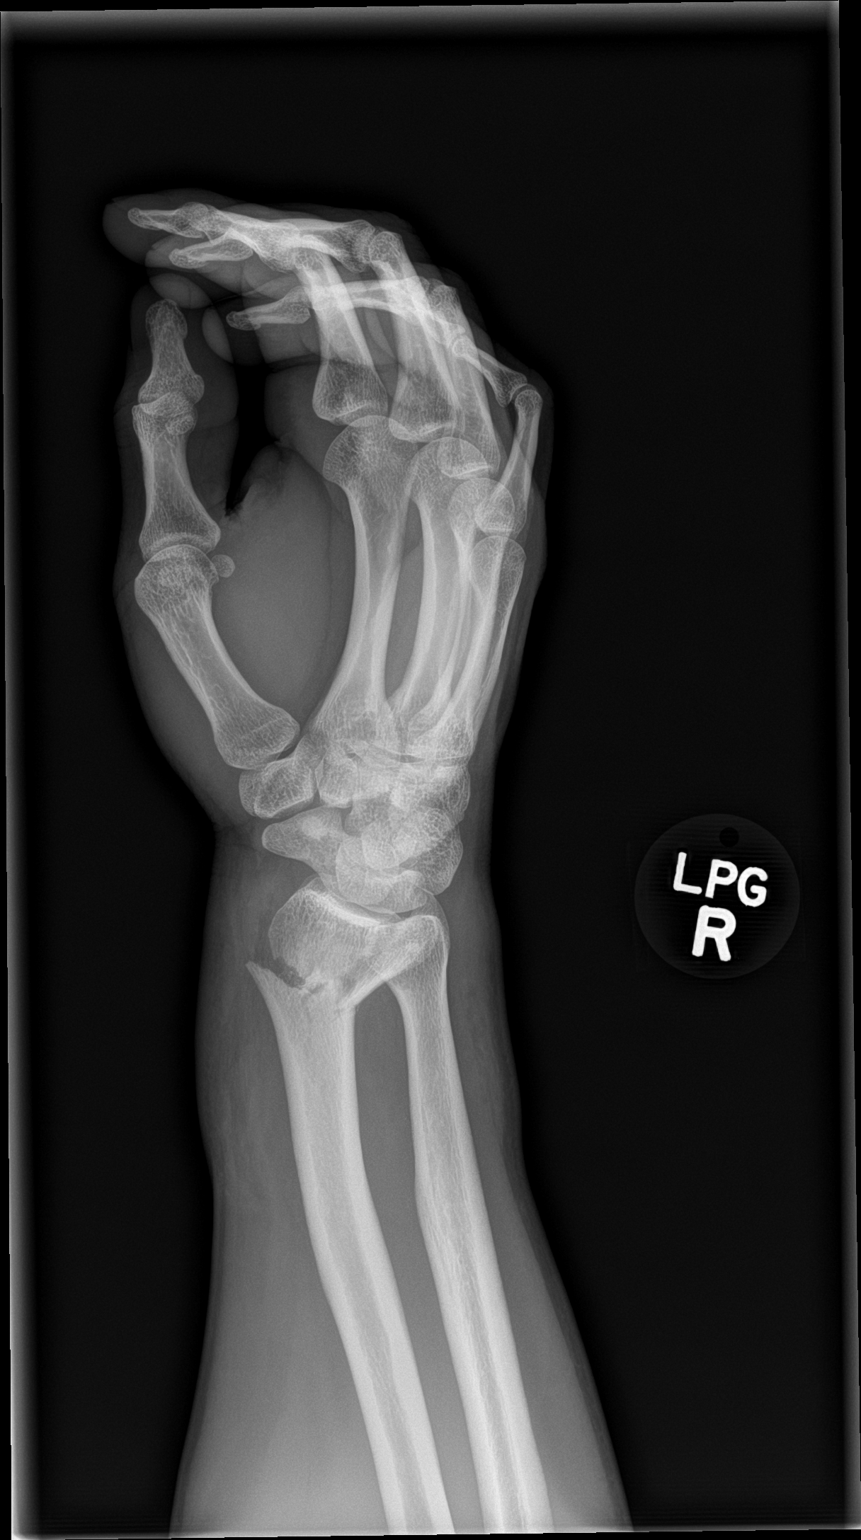

[wrist lat]
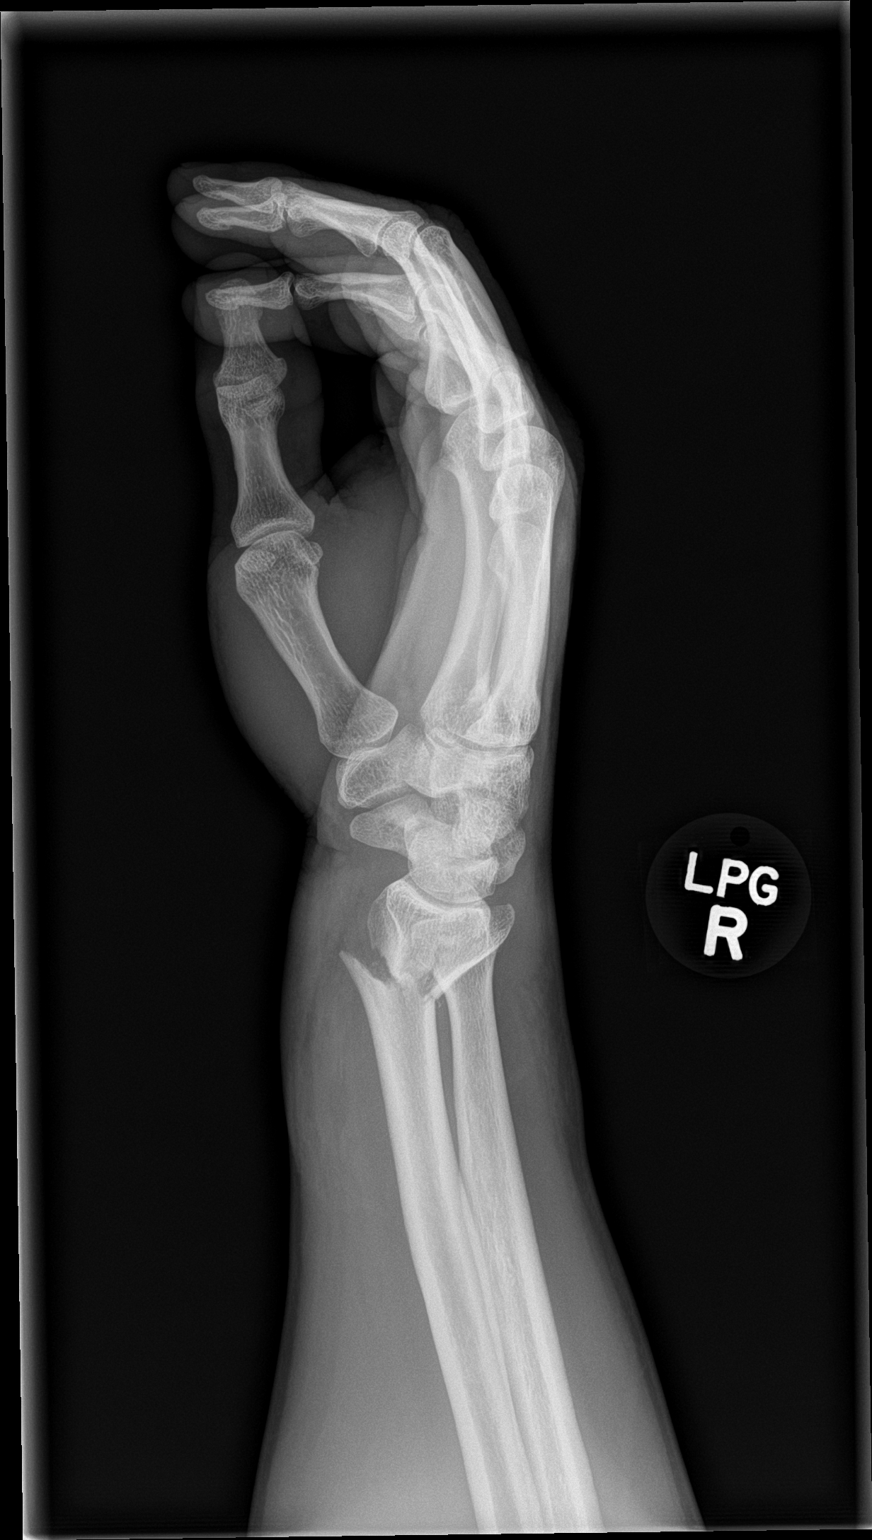

[wrist navicular]
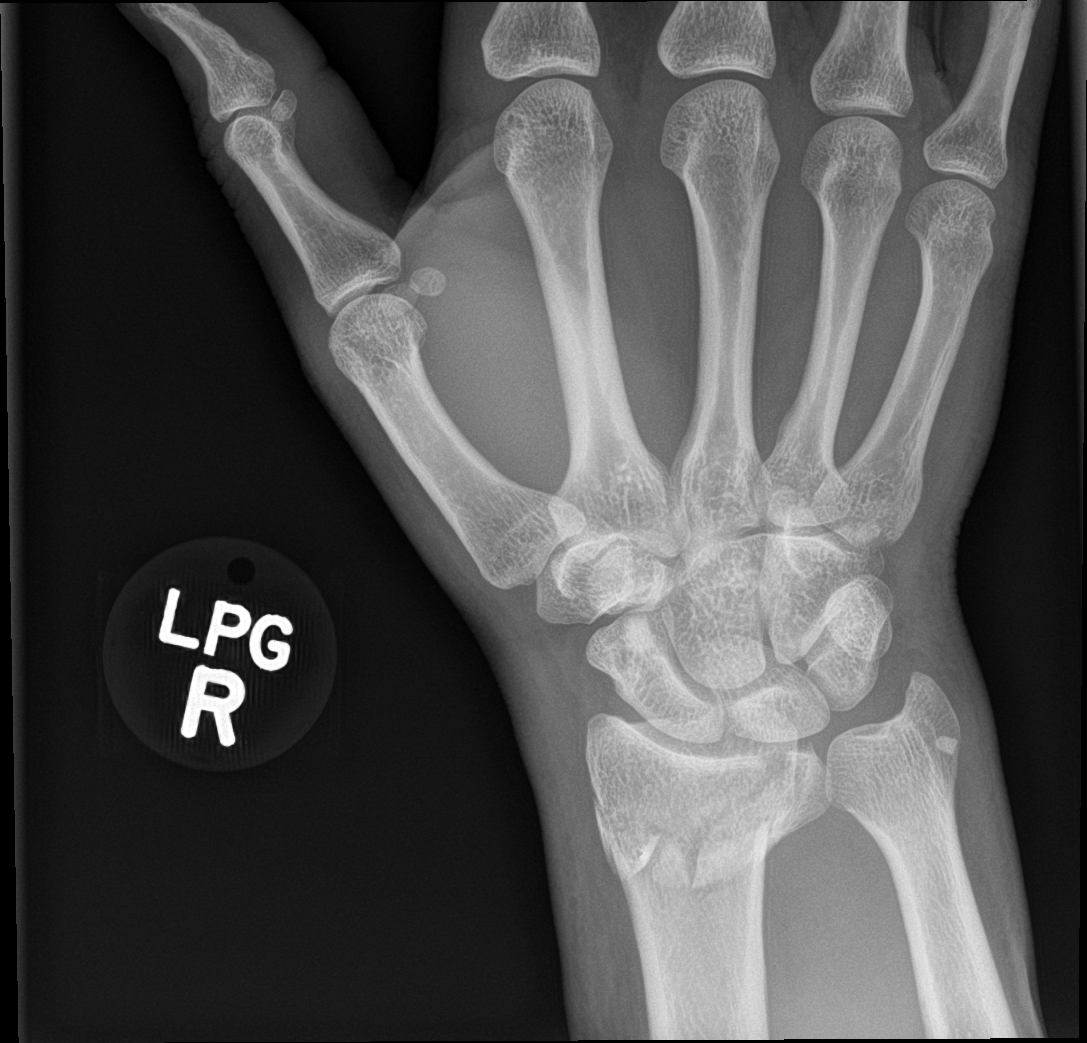

[4 of 4 positions shown; findings below may reference images not displayed]

FINDINGS: Displaced/comminuted fracture of the distal RIGHT radius, centered
within the distal metaphysis but with extension to the ulnar aspect
of the articular surface, with dorsal angulation of the distal
fracture fragment and approximately 4 mm greatest fracture
diastasis.

Small avulsion fracture fragment adjacent to the distal portion of
the ulna, without discrete donor site. Carpal bones appear intact
and normally aligned throughout.
IMPRESSION: 1. Displaced/comminuted fracture of the distal RIGHT radius, with
extension to the ulnar aspect of the articular surface, with dorsal
angulation of the distal fracture fragment and approximately 4 mm
greatest fracture diastasis.
2. Additional small avulsion fracture fragment adjacent to the
distal portion of the RIGHT ulna, of uncertain etiology, possibly
displaced from the distal radius fracture.
3. Carpal bones appear intact and normally aligned throughout.
